# Patient Record
Sex: Male | Born: 1990 | Race: Black or African American | Hispanic: No | Marital: Single | Smoking: Current every day smoker
Health system: Southern US, Community
[De-identification: ages and names within clinical notes are randomized; demographics above are authoritative.]

---

## 2015-09-27 ENCOUNTER — Emergency Department
Admission: EM | Admit: 2015-09-27 | Discharge: 2015-09-27 | Disposition: A | Discharge status: Home / Self Care | Payer: 59 | Attending: Emergency Medicine | Admitting: Emergency Medicine

## 2015-09-27 ENCOUNTER — Encounter: Payer: Self-pay | Admitting: Emergency Medicine

## 2015-09-27 ENCOUNTER — Emergency Department: Payer: 59

## 2015-09-27 DIAGNOSIS — F17210 Nicotine dependence, cigarettes, uncomplicated: Secondary | ICD-10-CM | POA: Diagnosis not present

## 2015-09-27 DIAGNOSIS — R51 Headache: Secondary | ICD-10-CM | POA: Insufficient documentation

## 2015-09-27 DIAGNOSIS — R569 Unspecified convulsions: Secondary | ICD-10-CM

## 2015-09-27 LAB — CBC
HEMATOCRIT: 45.1 % (ref 40.0–52.0)
Hemoglobin: 14.9 g/dL (ref 13.0–18.0)
MCH: 28.5 pg (ref 26.0–34.0)
MCHC: 33 g/dL (ref 32.0–36.0)
MCV: 86.4 fL (ref 80.0–100.0)
Platelets: 218 10*3/uL (ref 150–440)
RBC: 5.22 MIL/uL (ref 4.40–5.90)
RDW: 14.5 % (ref 11.5–14.5)
WBC: 17.1 10*3/uL — AB (ref 3.8–10.6)

## 2015-09-27 LAB — BASIC METABOLIC PANEL
Anion gap: 5 (ref 5–15)
BUN: 12 mg/dL (ref 6–20)
CHLORIDE: 102 mmol/L (ref 101–111)
CO2: 29 mmol/L (ref 22–32)
Calcium: 9.8 mg/dL (ref 8.9–10.3)
Creatinine, Ser: 0.93 mg/dL (ref 0.61–1.24)
GFR calc Af Amer: >60 mL/min (ref >60)
GFR calc non Af Amer: >60 mL/min (ref >60)
GLUCOSE: 103 mg/dL — AB (ref 65–99)
POTASSIUM: 5.2 mmol/L — AB (ref 3.5–5.1)
SODIUM: 136 mmol/L (ref 135–145)

## 2015-09-27 NOTE — Discharge Instructions (Signed)

## 2015-09-27 NOTE — ED Notes (Signed)
Pts GF states he was sleeping last night and suddenly started gurgling and states he was clenching his pillow with a stiff neck, denies any urinary incontinence, no bite marks on tongue, states he believes he had seizures when he was little but not on any medication, states he smokes pot everyday but denies any other drug use

## 2015-09-27 NOTE — ED Provider Notes (Signed)
[facility] Emergency Department Provider Note  Time seen: 11:15 AM  I have reviewed the triage vital signs and the nursing notes.   HISTORY  Chief Complaint Seizures    HPI Wesley Villanueva is a 24 y.o. male with no past medical history who presents to the emergency department after a likely seizure. According to the patient in the patient's girlfriend overnight the patient had approximately 45 second episode of generalized shaking. After which the patient fell asleep and was difficult to awaken. Girlfriend called EMS however states that the patient was agitated at the time, and ultimately refused transport. This morning they decided to come to the emergency department for evaluation with the parents. Per mom and dad and they state the patient has never had a full-blown seizure, however several times during his childhood he would stop and stare off would become unresponsive for several seconds and then would return to normal. Patient states mild headache currently otherwise denies any other symptoms. Denies urinary incontinence. Denies biting his tongue.     History reviewed. No pertinent past medical history.  There are no active problems to display for this patient.   History reviewed. No pertinent past surgical history.  No current outpatient prescriptions on file.  Allergies Review of patient's allergies indicates no known allergies.  No family history on file.  Social History Social History  Substance Use Topics  . Smoking status: Current Every Day Smoker    Types: Cigarettes  . Smokeless tobacco: None  . Alcohol Use: Yes    Review of Systems Constitutional: Negative for fever. Cardiovascular: Negative for chest pain. Respiratory: Negative for shortness of breath. Gastrointestinal: Negative for abdominal pain Genitourinary: Negative for dysuria. Negative incontinence Neurological: Mild headache. Denies focal weakness or  numbness. 10-point ROS otherwise negative.  ____________________________________________   PHYSICAL EXAM:  VITAL SIGNS: ED Triage Vitals  Enc Vitals Group     BP 09/27/15 1003 120/89 mmHg     Pulse Rate 09/27/15 1003 82     Resp 09/27/15 1003 18     Temp 09/27/15 1003 98.5 F (36.9 C)     Temp Source 09/27/15 1003 Oral     SpO2 09/27/15 1003 100 %     Weight 09/27/15 1003 155 lb (70.308 kg)     Height 09/27/15 1003  (1.727 m)     Head Cir --      Peak Flow --      Pain Score 09/27/15 1004 5     Pain Loc --      Pain Edu? --      Excl. in GC? --     Constitutional: Alert and oriented. Well appearing and in no distress. Eyes: Normal exam ENT   Head: Normocephalic and atraumatic   Mouth/Throat: Mucous membranes are moist. Cardiovascular: Normal rate, regular rhythm. No murmur Respiratory: Normal respiratory effort without tachypnea nor retractions. Breath sounds are clear and equal bilaterally. No wheezes/rales/rhonchi. Gastrointestinal: Soft and nontender. No distention Musculoskeletal: Nontender with normal range of motion in all extremities Neurologic:  Normal speech and language. No gross focal neurologic deficits are appreciated. Speech is normal. No pronator drift. Equal grip strengths. Ambulate without difficulty. Skin:  Skin is warm, dry and intact.  Psychiatric: Mood and affect are normal. Speech and behavior are normal.   ____________________________________________     RADIOLOGY  CT negative  ____________________________________________    INITIAL IMPRESSION / ASSESSMENT AND PLAN / ED COURSE  Pertinent labs & imaging results that were available  during my care of the patient were reviewed by me and considered in my medical decision making (see chart for details).  Patient presented to the emergency department after a likely seizure last night. Patient does admit to smoking marijuana last night, but denies any other drugs. The seizure could  possibly be related to marijuana use. Speaking with the parents it sounds like the patient has a history of possible abscense seizures as a child. Given his headache with likely first tonic-clonic seizure I discussed with the patient a CT scan, he is agreeable. We'll obtain a CT head, as well as basic labs. If the workup is normal the patient will be discharged home with neurology follow-up. Patient agreeable.  CT head shows no acute findings. Labs show a elevated white blood cell count which could support generalized tonic-clonic seizure. Potassium is slightly elevated as well. Normal creatinine. Overall the patient appears very well. No fever, normal vitals, minimal headache currently. Normal examination including normal neurologic exam. We will discharge the patient with neurology follow-up. Patient and parents are agreeable.  ____________________________________________   FINAL CLINICAL IMPRESSION(S) / ED DIAGNOSES  Tonic-clonic seizure   Minna AntisKevin Hollynn Garno, MD 09/27/15 1227

## 2015-09-27 NOTE — ED Notes (Signed)
Says he had ? Seizure last night.  Per girlfriend, ptstarted gurgling and tensing up and it lasted about 45 seconds.  Ems was called, but pt father says that the ems cussed them with the f word and then they just packed their stuff up and left.  No incontinence during incident.  Pt is amublatory and in nad currently.

## 2015-11-08 ENCOUNTER — Other Ambulatory Visit: Payer: Self-pay | Admitting: Neurology

## 2015-11-08 DIAGNOSIS — R569 Unspecified convulsions: Secondary | ICD-10-CM

## 2015-12-05 ENCOUNTER — Ambulatory Visit
Admission: RE | Admit: 2015-12-05 | Discharge: 2015-12-05 | Disposition: A | Discharge status: Home / Self Care | Payer: BLUE CROSS/BLUE SHIELD | Source: Ambulatory Visit | Attending: Neurology | Admitting: Neurology

## 2015-12-05 DIAGNOSIS — R569 Unspecified convulsions: Secondary | ICD-10-CM | POA: Diagnosis present

## 2015-12-05 DIAGNOSIS — G9389 Other specified disorders of brain: Secondary | ICD-10-CM | POA: Insufficient documentation

## 2017-03-18 IMAGING — MR MR HEAD W/O CM
8 of 10 series · 34 of 48 positions shown · non-contrast
Comparison: CT head without contrast 09/27/2015.

CLINICAL DATA: New onset seizures.  Two seizures since [REDACTED].

EXAM:
MRI HEAD WITHOUT CONTRAST
TECHNIQUE: Multiplanar, multiecho pulse sequences of the brain and surrounding
structures were obtained without intravenous contrast.

[Series 2: T1 · sagittal · 5.0mm · 0.45mm/px · 3 of 25 slices shown]
[im 1/25]
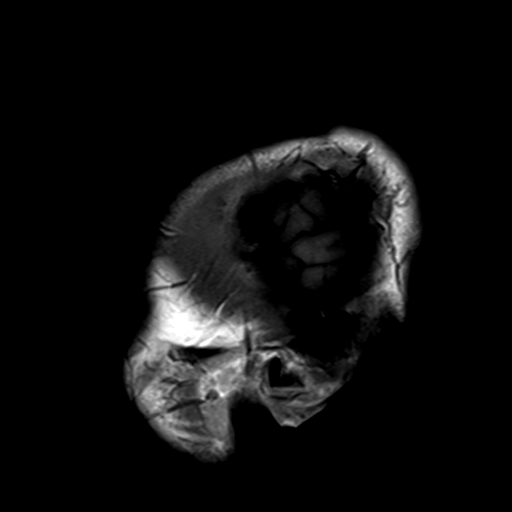
[im 13/25]
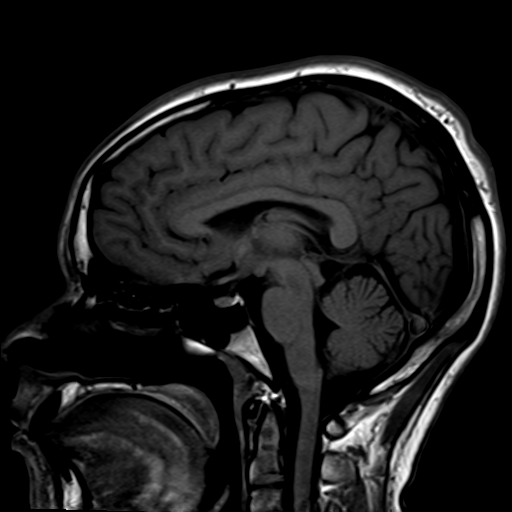
[im 25/25]
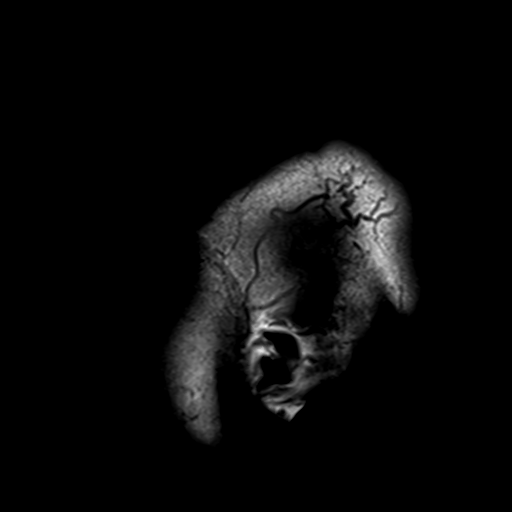

[Series 4: DWI · axial · 3.0mm · 1.80mm/px · z∈[-70,+90]mm · 5 of 42 slices shown (1 of 2)]
[im 1/42]
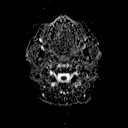
[im 11/42]
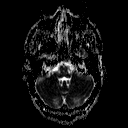
[im 21/42]
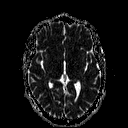
[im 31/42]
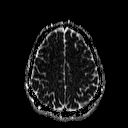
[im 42/42]
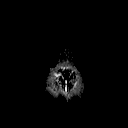

[Series 5: T2 · axial · 5.0mm · 0.60mm/px · z∈[-67,+89]mm · 4 of 25 slices shown (1 of 4)]
[im 1/25]
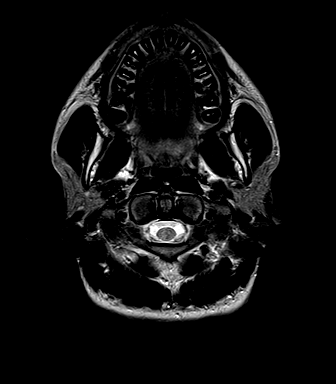
[im 9/25]
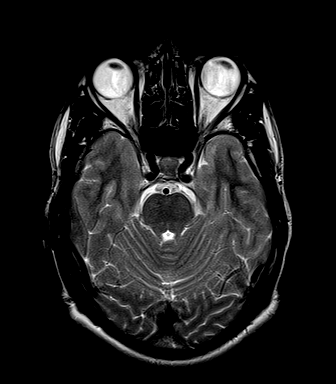
[im 17/25]
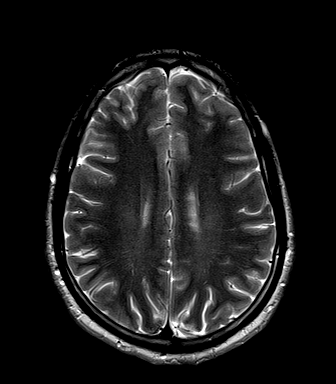
[im 25/25]
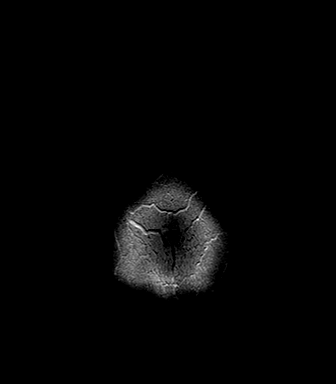

[Series 6: FLAIR · axial · 5.0mm · 0.45mm/px · z∈[-68,+88]mm · 4 of 25 slices shown]
[im 1/25]
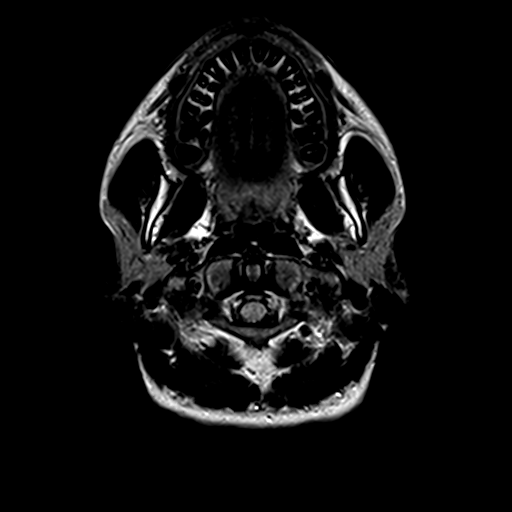
[im 9/25]
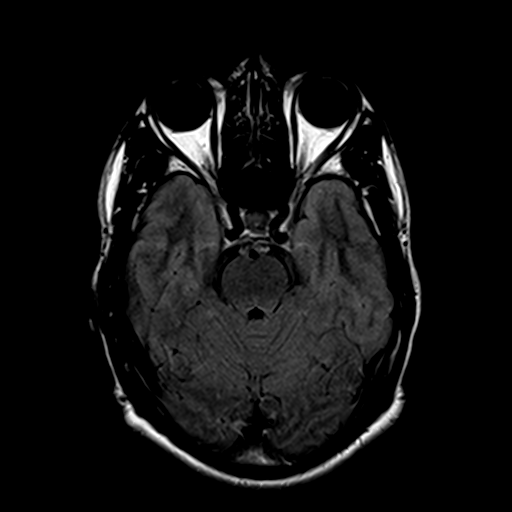
[im 17/25]
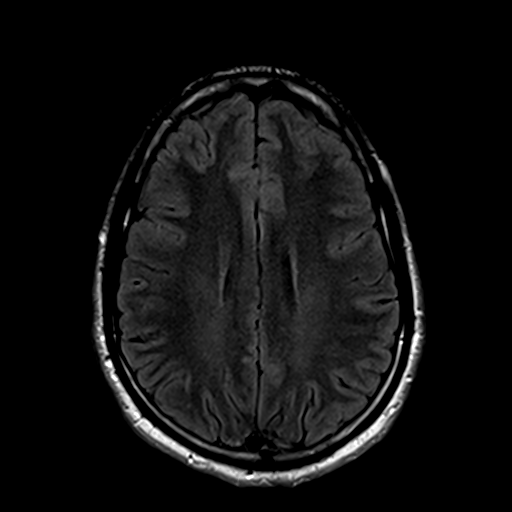
[im 25/25]
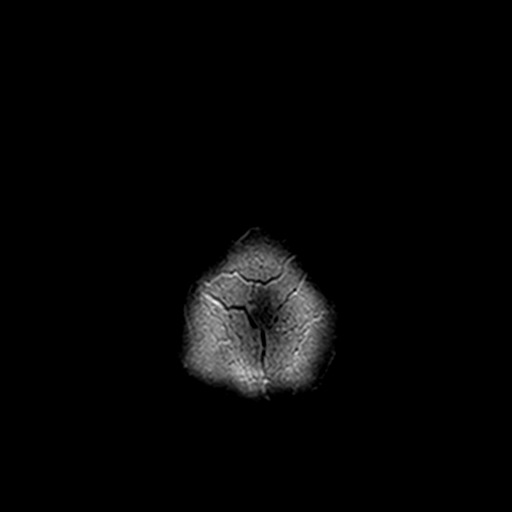

[Series 8: T2 · axial · 5.0mm · 0.45mm/px · z∈[-67,+89]mm · 4 of 25 slices shown (2 of 4)]
[im 1/25]
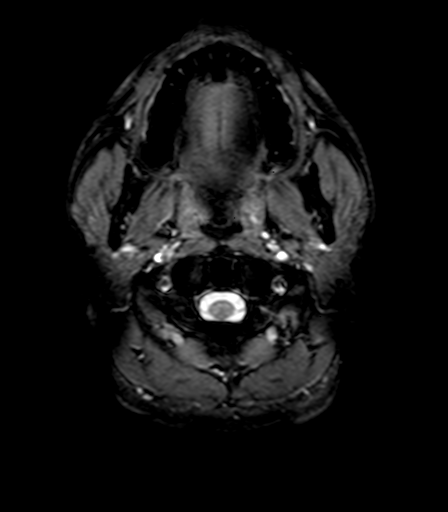
[im 9/25]
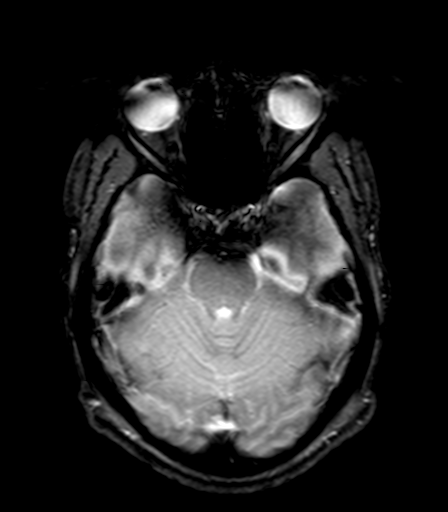
[im 17/25]
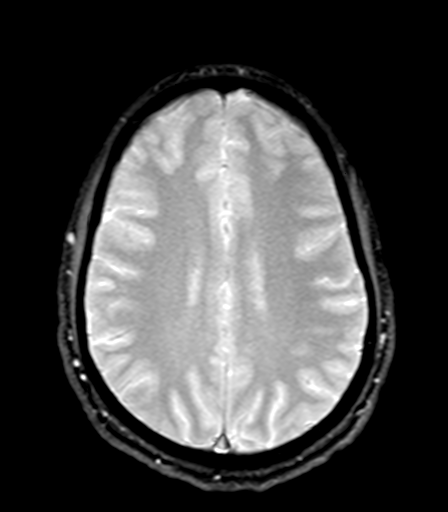
[im 25/25]
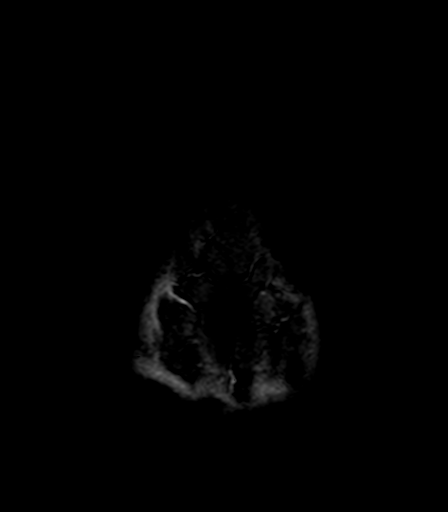

[Series 10: T2 · coronal · 3.0mm · 0.47mm/px · 4 of 27 slices shown (3 of 4)]
[im 1/27]
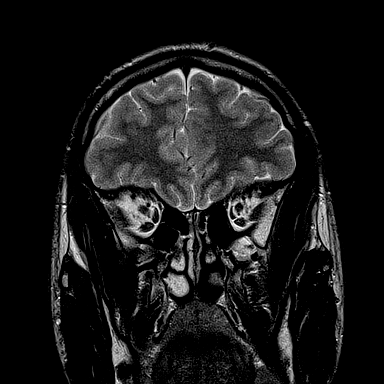
[im 9/27]
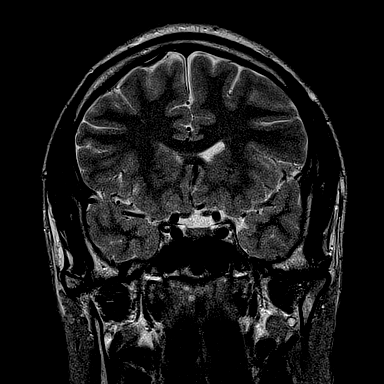
[im 18/27]
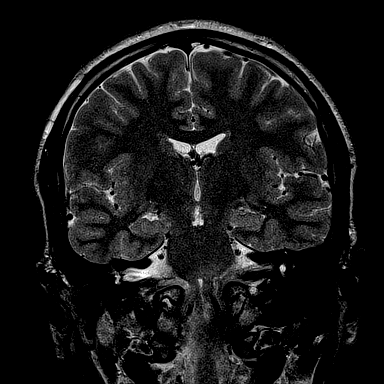
[im 27/27]
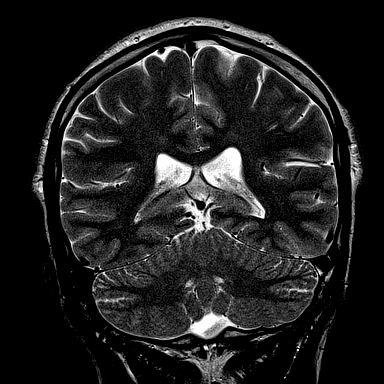

[Series 11: T2 · coronal · 5.0mm · 0.49mm/px · 4 of 31 slices shown (4 of 4)]
[im 1/31]
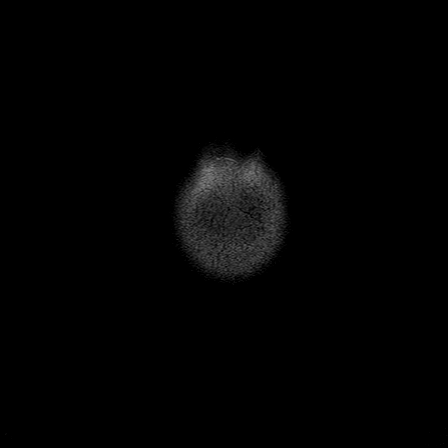
[im 11/31]
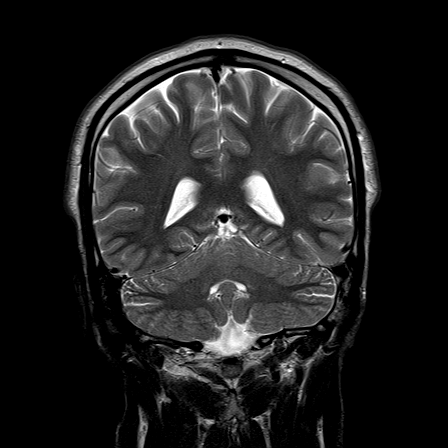
[im 21/31]
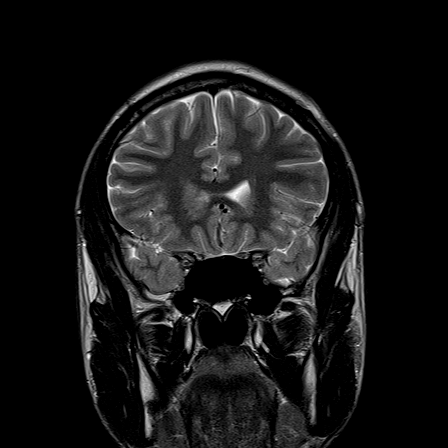
[im 31/31]
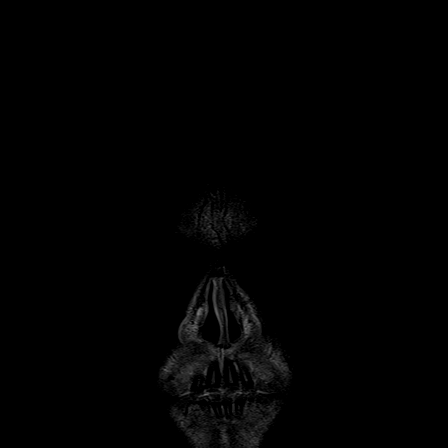

[Series 100: DWI · axial · 3.0mm · 1.80mm/px · z∈[-70,+90]mm · 6 of 42 slices shown (2 of 2)]
[im 1/42]
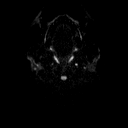
[im 9/42]
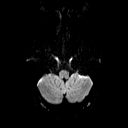
[im 17/42]
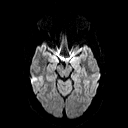
[im 25/42]
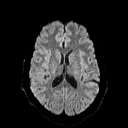
[im 33/42]
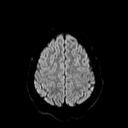
[im 42/42]
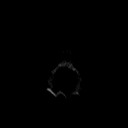

[34 of 48 positions shown; findings below may reference images not displayed]

FINDINGS: No acute infarct, hemorrhage, or mass lesion is present. The
ventricles are of normal size. No significant extraaxial fluid
collection is present.

No significant white matter changes are present.

There is asymmetric T2 hyperintensity within the medial left
temporal lobe and hippocampus. The hippocampus is mildly swollen
anteriorly. No discrete mass lesion is present.

The internal auditory canals are within normal limits bilaterally.
The brainstem and cerebellum are within normal limits. Flow is
present in the major intracranial arteries.

Globes and orbits are intact. The paranasal sinuses and mastoid air
cells are clear.
IMPRESSION: 1. Mild increased T2 signal within the medial left temporal lobe and
swelling of the hippocampus. This may be related to recent seizure
activity or the focus of the seizure activity. No discrete mass
lesion is evident.
2. Otherwise unremarkable MRI of the brain.

## 2020-01-16 ENCOUNTER — Ambulatory Visit: Payer: 59 | Attending: Internal Medicine

## 2020-01-16 DIAGNOSIS — Z23 Encounter for immunization: Secondary | ICD-10-CM | POA: Insufficient documentation

## 2020-01-16 NOTE — Progress Notes (Signed)
   Covid-19 Vaccination Clinic  Name:  Wesley Villanueva    MRN: [id number] DOB: [birth year]  01/16/2020  [name] was observed post Covid-19 immunization for 15 minutes without incident. He was provided with Vaccine Information Sheet and instruction to access the V-Safe system.   [name] was instructed to call 911 with any severe reactions post vaccine: Marland Kitchen Difficulty breathing  . Swelling of face and throat  . A fast heartbeat  . A bad rash all over body  . Dizziness and weakness   Immunizations Administered    Name Date Dose VIS Date Route   Pfizer COVID-19 Vaccine 01/16/2020 12:49 PM 0.3 mL 10/22/2019 Intramuscular   Manufacturer: [employer], [employer]   Lot: [id number]   NDC: [address]-6

## 2020-02-08 ENCOUNTER — Ambulatory Visit: Payer: 59 | Attending: Internal Medicine

## 2020-02-08 DIAGNOSIS — Z23 Encounter for immunization: Secondary | ICD-10-CM

## 2020-02-08 NOTE — Progress Notes (Signed)
   Covid-19 Vaccination Clinic  Name:  Wesley Villanueva    MRN: [id number] DOB: [birth year]  02/08/2020  [name] was observed post Covid-19 immunization for 15 minutes without incident. He was provided with Vaccine Information Sheet and instruction to access the V-Safe system.   [name] was instructed to call 911 with any severe reactions post vaccine: Marland Kitchen Difficulty breathing  . Swelling of face and throat  . A fast heartbeat  . A bad rash all over body  . Dizziness and weakness   Immunizations Administered    Name Date Dose VIS Date Route   Pfizer COVID-19 Vaccine 02/08/2020  1:27 PM 0.3 mL 10/22/2019 Intramuscular   Manufacturer: [employer], [employer]   Lot: [id number]   NDC: [address]-7
